# Patient Record
Sex: Female | Born: 1945 | Race: White | Hispanic: No | Marital: Single | Smoking: Never smoker
Health system: Southern US, Community
[De-identification: ages and names within clinical notes are randomized; demographics above are authoritative.]

## PROBLEM LIST (undated history)

## (undated) DIAGNOSIS — I809 Phlebitis and thrombophlebitis of unspecified site: Secondary | ICD-10-CM

## (undated) HISTORY — DX: Phlebitis and thrombophlebitis of unspecified site: I80.9

## (undated) HISTORY — PX: OVARIAN CYST REMOVAL: SHX89

## (undated) HISTORY — PX: VEIN SURGERY: SHX48

## (undated) HISTORY — PX: TONSILLECTOMY: SUR1361

## (undated) HISTORY — PX: BUNIONECTOMY: SHX129

## (undated) HISTORY — PX: ABDOMINAL HYSTERECTOMY: SHX81

## (undated) HISTORY — PX: DILATION AND CURETTAGE OF UTERUS: SHX78

## (undated) HISTORY — DX: Gilbert syndrome: E80.4

---

## 2000-07-16 ENCOUNTER — Other Ambulatory Visit: Admission: RE | Admit: 2000-07-16 | Discharge: 2000-07-16 | Payer: Self-pay | Admitting: Obstetrics and Gynecology

## 2001-07-29 ENCOUNTER — Other Ambulatory Visit: Admission: RE | Admit: 2001-07-29 | Discharge: 2001-07-29 | Payer: Self-pay | Admitting: Obstetrics and Gynecology

## 2002-02-04 ENCOUNTER — Other Ambulatory Visit: Admission: RE | Admit: 2002-02-04 | Discharge: 2002-02-04 | Payer: Self-pay | Admitting: Obstetrics and Gynecology

## 2002-05-02 ENCOUNTER — Encounter: Payer: Self-pay | Admitting: Obstetrics and Gynecology

## 2002-05-02 ENCOUNTER — Ambulatory Visit (HOSPITAL_COMMUNITY): Admission: RE | Admit: 2002-05-02 | Discharge: 2002-05-02 | Payer: Self-pay | Admitting: Obstetrics and Gynecology

## 2002-06-04 ENCOUNTER — Other Ambulatory Visit: Admission: RE | Admit: 2002-06-04 | Discharge: 2002-06-04 | Payer: Self-pay | Admitting: Obstetrics and Gynecology

## 2002-11-10 ENCOUNTER — Other Ambulatory Visit: Admission: RE | Admit: 2002-11-10 | Discharge: 2002-11-10 | Payer: Self-pay | Admitting: Obstetrics and Gynecology

## 2004-06-15 ENCOUNTER — Other Ambulatory Visit: Admission: RE | Admit: 2004-06-15 | Discharge: 2004-06-15 | Payer: Self-pay | Admitting: Obstetrics and Gynecology

## 2004-06-30 ENCOUNTER — Encounter: Payer: Self-pay | Admitting: Internal Medicine

## 2004-08-11 ENCOUNTER — Ambulatory Visit: Payer: Self-pay | Admitting: Internal Medicine

## 2004-09-07 ENCOUNTER — Ambulatory Visit: Payer: Self-pay | Admitting: Internal Medicine

## 2004-11-18 ENCOUNTER — Ambulatory Visit: Payer: Self-pay | Admitting: Internal Medicine

## 2004-11-25 ENCOUNTER — Ambulatory Visit: Payer: Self-pay | Admitting: Internal Medicine

## 2004-12-12 ENCOUNTER — Other Ambulatory Visit: Admission: RE | Admit: 2004-12-12 | Discharge: 2004-12-12 | Payer: Self-pay | Admitting: Obstetrics and Gynecology

## 2004-12-26 ENCOUNTER — Ambulatory Visit: Payer: Self-pay | Admitting: Gastroenterology

## 2005-01-16 ENCOUNTER — Ambulatory Visit: Payer: Self-pay | Admitting: Internal Medicine

## 2005-02-13 ENCOUNTER — Ambulatory Visit: Payer: Self-pay | Admitting: Internal Medicine

## 2005-05-09 ENCOUNTER — Ambulatory Visit: Payer: Self-pay | Admitting: Internal Medicine

## 2005-07-03 ENCOUNTER — Ambulatory Visit: Payer: Self-pay | Admitting: Internal Medicine

## 2005-07-14 ENCOUNTER — Ambulatory Visit: Payer: Self-pay | Admitting: Internal Medicine

## 2005-08-02 ENCOUNTER — Ambulatory Visit: Payer: Self-pay | Admitting: Internal Medicine

## 2005-09-07 ENCOUNTER — Other Ambulatory Visit: Admission: RE | Admit: 2005-09-07 | Discharge: 2005-09-07 | Payer: Self-pay | Admitting: Obstetrics and Gynecology

## 2005-10-20 ENCOUNTER — Ambulatory Visit: Payer: Self-pay | Admitting: Internal Medicine

## 2005-11-01 ENCOUNTER — Encounter: Admission: RE | Admit: 2005-11-01 | Discharge: 2005-11-01 | Payer: Self-pay | Admitting: Internal Medicine

## 2006-03-16 ENCOUNTER — Ambulatory Visit: Payer: Self-pay | Admitting: Internal Medicine

## 2006-04-16 ENCOUNTER — Ambulatory Visit: Payer: Self-pay | Admitting: Internal Medicine

## 2006-06-14 ENCOUNTER — Ambulatory Visit: Payer: Self-pay | Admitting: Internal Medicine

## 2009-03-01 ENCOUNTER — Ambulatory Visit: Payer: Self-pay | Admitting: Internal Medicine

## 2009-03-01 DIAGNOSIS — R51 Headache: Secondary | ICD-10-CM | POA: Insufficient documentation

## 2009-03-01 DIAGNOSIS — F329 Major depressive disorder, single episode, unspecified: Secondary | ICD-10-CM

## 2009-03-01 DIAGNOSIS — R519 Headache, unspecified: Secondary | ICD-10-CM | POA: Insufficient documentation

## 2009-03-01 DIAGNOSIS — R55 Syncope and collapse: Secondary | ICD-10-CM | POA: Insufficient documentation

## 2009-03-01 DIAGNOSIS — M25519 Pain in unspecified shoulder: Secondary | ICD-10-CM | POA: Insufficient documentation

## 2009-03-01 DIAGNOSIS — F3289 Other specified depressive episodes: Secondary | ICD-10-CM | POA: Insufficient documentation

## 2009-03-01 DIAGNOSIS — R6883 Chills (without fever): Secondary | ICD-10-CM | POA: Insufficient documentation

## 2009-03-01 DIAGNOSIS — I10 Essential (primary) hypertension: Secondary | ICD-10-CM | POA: Insufficient documentation

## 2009-03-03 ENCOUNTER — Inpatient Hospital Stay (HOSPITAL_COMMUNITY): Admission: AD | Admit: 2009-03-03 | Discharge: 2009-03-05 | Payer: Self-pay | Admitting: Internal Medicine

## 2009-03-03 DIAGNOSIS — J45909 Unspecified asthma, uncomplicated: Secondary | ICD-10-CM | POA: Insufficient documentation

## 2009-03-03 DIAGNOSIS — K5730 Diverticulosis of large intestine without perforation or abscess without bleeding: Secondary | ICD-10-CM | POA: Insufficient documentation

## 2009-05-14 ENCOUNTER — Telehealth (INDEPENDENT_AMBULATORY_CARE_PROVIDER_SITE_OTHER): Payer: Self-pay | Admitting: *Deleted

## 2009-05-31 ENCOUNTER — Ambulatory Visit: Payer: Self-pay | Admitting: Internal Medicine

## 2009-07-13 ENCOUNTER — Encounter (INDEPENDENT_AMBULATORY_CARE_PROVIDER_SITE_OTHER): Payer: Self-pay | Admitting: *Deleted

## 2009-07-21 ENCOUNTER — Ambulatory Visit: Payer: Self-pay | Admitting: Internal Medicine

## 2009-08-09 ENCOUNTER — Telehealth (INDEPENDENT_AMBULATORY_CARE_PROVIDER_SITE_OTHER): Payer: Self-pay | Admitting: *Deleted

## 2009-08-11 ENCOUNTER — Telehealth (INDEPENDENT_AMBULATORY_CARE_PROVIDER_SITE_OTHER): Payer: Self-pay | Admitting: *Deleted

## 2009-08-17 ENCOUNTER — Encounter: Payer: Self-pay | Admitting: Internal Medicine

## 2009-10-11 ENCOUNTER — Telehealth (INDEPENDENT_AMBULATORY_CARE_PROVIDER_SITE_OTHER): Payer: Self-pay | Admitting: *Deleted

## 2009-10-13 ENCOUNTER — Telehealth (INDEPENDENT_AMBULATORY_CARE_PROVIDER_SITE_OTHER): Payer: Self-pay | Admitting: *Deleted

## 2009-10-13 ENCOUNTER — Encounter (INDEPENDENT_AMBULATORY_CARE_PROVIDER_SITE_OTHER): Payer: Self-pay | Admitting: *Deleted

## 2009-11-22 ENCOUNTER — Telehealth (INDEPENDENT_AMBULATORY_CARE_PROVIDER_SITE_OTHER): Payer: Self-pay | Admitting: *Deleted

## 2009-12-27 ENCOUNTER — Ambulatory Visit: Payer: Self-pay | Admitting: Internal Medicine

## 2009-12-28 LAB — CONVERTED CEMR LAB
Basophils Relative: 1.1 % (ref 0.0–3.0)
Eosinophils Relative: 3 % (ref 0.0–5.0)
Hemoglobin: 13.3 g/dL (ref 12.0–15.0)
Lymphocytes Relative: 24.6 % (ref 12.0–46.0)
MCV: 95.2 fL (ref 78.0–100.0)
Monocytes Absolute: 0.7 10*3/uL (ref 0.1–1.0)
Neutro Abs: 2.7 10*3/uL (ref 1.4–7.7)
Neutrophils Relative %: 56.7 % (ref 43.0–77.0)
RBC: 4.04 M/uL (ref 3.87–5.11)
WBC: 4.7 10*3/uL (ref 4.5–10.5)

## 2010-04-13 ENCOUNTER — Telehealth (INDEPENDENT_AMBULATORY_CARE_PROVIDER_SITE_OTHER): Payer: Self-pay | Admitting: *Deleted

## 2010-04-13 ENCOUNTER — Encounter: Payer: Self-pay | Admitting: Internal Medicine

## 2010-04-14 ENCOUNTER — Observation Stay (HOSPITAL_COMMUNITY): Admission: EM | Admit: 2010-04-14 | Discharge: 2010-04-15 | Payer: Self-pay | Admitting: Emergency Medicine

## 2010-05-04 ENCOUNTER — Telehealth: Payer: Self-pay | Admitting: Internal Medicine

## 2010-08-09 ENCOUNTER — Telehealth (INDEPENDENT_AMBULATORY_CARE_PROVIDER_SITE_OTHER): Payer: Self-pay | Admitting: *Deleted

## 2010-10-11 NOTE — Progress Notes (Signed)
Summary: Melanie Mcneil personal issue would like a call  Phone Note Call from Patient Call back at [facility] Phone [phone]   Caller: Patient Summary of Call: pt called stating that it was a personal matter and she would like for dr hopper to give her a call no further detail was given................Marland KitchenFelecia Deloach CMA  October 13, 2009 5:05 PM   Follow-up for Phone Call        I called 8am & 7:10 pm & got voice mail. I asked her to tell you what she needed so we could get started on any issue of concern  & left my email. Follow-up by: Marga Melnick MD,  October 14, 2009 7:11 PM  Additional Follow-up for Phone Call Additional follow up Details #1::        pt states that she received her bone density along with a copy of someone else. pt states that she will shred other pt copy and call to give other pt name so that we may get a copy to that pt. pt just want you to know it was nothing dealing with her but she did not want to broadcast it because she know it is a violation. pt states that she will come in when she gets back in town to discuss her bone density results..........Marland KitchenFelecia Deloach CMA  October 15, 2009 8:47 AM     Additional Follow-up for Phone Call Additional follow up Details #2::    thank her very much; she can bring that report also when she comes in Follow-up by: Marga Melnick MD,  October 15, 2009 1:27 PM  Additional Follow-up for Phone Call Additional follow up Details #3:: Details for Additional Follow-up Action Taken: left message to call  office..............Marland KitchenFelecia Deloach CMA  October 18, 2009 4:24 PM   pt called back with pt name,bone density mailed to correct pt. thank pt for information..................Marland KitchenFelecia Deloach CMA  October 21, 2009 11:07 AM

## 2010-10-11 NOTE — Letter (Signed)
Summary: [facility]  [facility]   Imported By: Lanelle Bal 04/26/2010 08:40:44  _____________________________________________________________________  External Attachment:    Type:   Image     Comment:   External Document

## 2010-10-11 NOTE — Progress Notes (Signed)
Summary: Refill Request  Phone Note Refill Request Message from:  Pharmacy on [facility] [address] Fax #: [phone]  Refills Requested: Medication #1:  HYDROCHLOROTHIAZIDE 12.5 MG TABS 1 by mouth two times a day   Dosage confirmed as above?Dosage Confirmed   Supply Requested: 1 month   Last Refilled: 09/06/2009 Next Appointment Scheduled: none Initial call taken by: Harold Barban,  November 22, 2009 1:21 PM    Prescriptions: HYDROCHLOROTHIAZIDE 12.5 MG TABS (HYDROCHLOROTHIAZIDE) 1 by mouth two times a day  #30 x 5   Entered by:   Shonna Chock   Authorized by:   Marga Melnick MD   Signed by:   Shonna Chock on 11/22/2009   Method used:   Electronically to        [facility] [address] (retail)       [address]       [place], [place]  [address]       Ph: [phone]       Fax: [phone]   RxID:   [id number]

## 2010-10-11 NOTE — Progress Notes (Signed)
Summary: tried to call several times no answers letter mailed  Phone Note Call from Patient Call back at Home Phone [phone]   Caller: Patient Summary of Call: pt left VM that she would like for Dr Alwyn Ren to give her a call so that she may give him a update. left message to call office............Marland KitchenFelecia Deloach CMA  October 11, 2009 2:47 PM  left message to call office........................Marland KitchenFelecia Deloach CMA  October 12, 2009 10:49 AM  left message to call office., letter mailed....................Marland KitchenFelecia Deloach CMA  October 13, 2009 10:22 AM

## 2010-10-11 NOTE — Assessment & Plan Note (Signed)
Summary: coughing/congestion/kdc   Vital Signs:  Patient profile:   65 year old female Weight:      136.6 pounds Temp:     98.1 degrees F oral Pulse rate:   76 / minute Resp:     14 per minute BP sitting:   136 / 80  (left arm) Cuff size:   large  Vitals Entered By: Shonna Chock (December 27, 2009 10:43 AM) CC: 1.) Cough and congestion. Took Levaquin x 6 weeks ago for Bronchitis and symptoms never cleared  2.) Discuss Iron (recommended by GYN) Comments REVIEWED MED LIST, PATIENT AGREED DOSE AND INSTRUCTION CORRECT    CC:  1.) Cough and congestion. Took Levaquin x 6 weeks ago for Bronchitis and symptoms never cleared  2.) Discuss Iron (recommended by GYN).  History of Present Illness: Cough after bronchitis 6 weeks ago ; treated with Levaquin 500 mg X 7 days.Fever & SOB improved ; cough never resolved completely. Cough worse with allergen exposure. The patient reports non-productive cough, shortness of breath, wheezing, exertional dyspnea, and malaise, but denies pleuritic chest pain, fever, and hemoptysis.  The patient denies the following symptoms: sore throat, nasal congestion, chronic rhinitis, weight loss, acid reflux symptoms, and peripheral edema.  Ineffective prior treatments have included OTC cough medication.  Risk factors include history of allergic rhinitis and history of asthma.  No Symbicort X 3 weeks. Singulair costs $138 / month.  Allergies: 1)  ! Amoxicillin 2)  ! Augmentin 3)  ! Prednisone  Review of Systems General:  Denies chills, fever, and sweats. ENT:  Denies difficulty swallowing, ear discharge, earache, hoarseness, and nosebleeds; No frontal headaches , facial pain or purulence. Nares dry. Resp:  Denies coughing up blood; With Mucinex D  she has had thick green secretions; mainly cough is dry.  Physical Exam  General:  in no acute distress; alert,appropriate and cooperative throughout examination Ears:  External ear exam shows no significant lesions or  deformities.  Otoscopic examination reveals clear canals, tympanic membranes are intact bilaterally without bulging, retraction, inflammation or discharge. Hearing is grossly normal bilaterally. Nose:  External nasal examination shows no deformity or inflammation. Nasal mucosa are dry without lesions or exudates. Mouth:  Oral mucosa and oropharynx without lesions or exudates.  Teeth in good repair. Lungs:  Normal respiratory effort, chest expands symmetrically. Lungs are clear to auscultation, no  wheezes ; mild rales. Heart:  Normal rate and regular rhythm. S1 and S2 normal without gallop, murmur, click, rub. Extremities:  No clubbing, cyanosis, edema. Cervical Nodes:  No lymphadenopathy noted Axillary Nodes:  No palpable lymphadenopathy   Impression & Recommendations:  Problem # 1:  BRONCHITIS-ACUTE (ICD-466.0)  The following medications were removed from the medication list:    Clindamycin Hcl 300 Mg Caps (Clindamycin hcl) ..... I q 6 hrs    Smz-tmp Ds 800-160 Mg Tabs (Sulfamethoxazole-trimethoprim) .Marland Kitchen... 1 two times a day Her updated medication list for this problem includes:    Singulair 10 Mg Tabs (Montelukast sodium) .Marland Kitchen... 1 by mouth once daily    Symbicort 160-4.5 Mcg/act Aero (Budesonide-formoterol fumarate) .Marland Kitchen... As needed    Promethazine Vc/codeine 6.25-5-10 Mg/21ml Syrp (Phenyleph-promethazine-cod) .Marland Kitchen... 1 tsp every 6 hrs as needed cough    Azithromycin 250 Mg Tabs (Azithromycin) .Marland Kitchen... As per pack    Ventolin Hfa 108 (90 Base) Mcg/act Aers (Albuterol sulfate) .Marland Kitchen... 1-2 puffs every 4 hrs as needed cough  Orders: T-2 View CXR (71020TC) Venipuncture (01093) TLB-CBC Platelet - w/Differential (85025-CBCD)  Problem # 2:  ASTHMA (  ICD-493.90)  PMH of Her updated medication list for this problem includes:    Singulair 10 Mg Tabs (Montelukast sodium) .Marland Kitchen... 1 by mouth once daily    Symbicort 160-4.5 Mcg/act Aero (Budesonide-formoterol fumarate) .Marland Kitchen... As needed    Ventolin Hfa 108  (90 Base) Mcg/act Aers (Albuterol sulfate) .Marland Kitchen... 1-2 puffs every 4 hrs as needed cough  Orders: T-2 View CXR (71020TC) Venipuncture (29528) TLB-CBC Platelet - w/Differential (85025-CBCD)  Complete Medication List: 1)  Effexor Xr 75 Mg Xr24h-cap (Venlafaxine hcl) .Marland Kitchen.. 1 by mouth once daily 2)  Wellbutrin Xl 150 Mg Xr24h-tab (Bupropion hcl) .Marland Kitchen.. 1 by mouth once daily 3)  Hydrochlorothiazide 12.5 Mg Tabs (Hydrochlorothiazide) .Marland Kitchen.. 1 by mouth two times a day 4)  Singulair 10 Mg Tabs (Montelukast sodium) .Marland Kitchen.. 1 by mouth once daily 5)  Patanol 0.1 % Soln (Olopatadine hcl) .... As needed 6)  Aleve 220 Mg Tabs (Naproxen sodium) .... 6 tablets daily 7)  Symbicort 160-4.5 Mcg/act Aero (Budesonide-formoterol fumarate) .... As needed 8)  Premarin (? Dose)  .... 2 x weekly 9)  Promethazine Vc/codeine 6.25-5-10 Mg/65ml Syrp (Phenyleph-promethazine-cod) .Marland Kitchen.. 1 tsp every 6 hrs as needed cough 10)  Azithromycin 250 Mg Tabs (Azithromycin) .... As per pack 11)  Ventolin Hfa 108 (90 Base) Mcg/act Aers (Albuterol sulfate) .Marland Kitchen.. 1-2 puffs every 4 hrs as needed cough  Patient Instructions: 1)  Drink as much fluid as you can tolerate for the next few days. Prescriptions: VENTOLIN HFA 108 (90 BASE) MCG/ACT AERS (ALBUTEROL SULFATE) 1-2 puffs every 4 hrs as needed cough  #1 x 2   Entered and Authorized by:   Marga Melnick MD   Signed by:   Marga Melnick MD on 12/27/2009   Method used:   Print then Give to Patient   RxID:   [id number] AZITHROMYCIN 250 MG TABS (AZITHROMYCIN) as per pack  #1 x 0   Entered and Authorized by:   Marga Melnick MD   Signed by:   Marga Melnick MD on 12/27/2009   Method used:   Print then Give to Patient   RxID:   [id number] PROMETHAZINE VC/CODEINE 6.25-5-10 MG/5ML SYRP (PHENYLEPH-PROMETHAZINE-COD) 1 tsp every 6 hrs as needed cough  #120cc x 0   Entered and Authorized by:   Marga Melnick MD   Signed by:   Marga Melnick MD on 12/27/2009   Method used:   Print then Give  to Patient   RxID:   [id number]

## 2010-10-11 NOTE — Letter (Signed)
Summary: Generic Letter  [facility] [place]  [address] [place], [place] [address]   Phone: [phone]  Fax: [phone]    10/13/2009  Re[place] [address] [place], [place]  [address]  Dear Ms. Croker,    We have tried several times to contact you in regard to a message left by you. If you still would like to update this information please give [place] a call back at [phone].       Sincerely,       Chrissie Noa Hopper,MD

## 2010-10-11 NOTE — Progress Notes (Signed)
Summary: Call Back  Phone Note Call from Patient Call back at Home Phone [phone]   Caller: Patient Summary of Call: Patient called and would like you to call her back. After hours is ok. She has some questions.  Initial call taken by: Freddy Jaksch,  May 04, 2010 3:11 PM  Follow-up for Phone Call        left phone message for her to email me with ?s Follow-up by: Marga Melnick MD,  May 05, 2010 5:36 PM

## 2010-10-11 NOTE — Progress Notes (Signed)
Summary: refill  Phone Note Refill Request Message from:  Patient  Refills Requested: Medication #1:  SINGULAIR 10 MG TABS 1 by mouth once daily patient lives in [place] - she will be in town today wants to pick up rx  Initial call taken by: Okey Regal Spring,  August 09, 2010 12:05 PM    Prescriptions: SINGULAIR 10 MG TABS (MONTELUKAST SODIUM) 1 by mouth once daily  #30 x 5   Entered by:   Shonna Chock CMA   Authorized by:   Marga Melnick MD   Signed by:   Shonna Chock CMA on 08/09/2010   Method used:   Print then Give to Patient   RxID:   [id number]

## 2010-10-11 NOTE — Progress Notes (Signed)
Summary: Kidney stone  Phone Note Call from Patient Call back at Home Phone [phone]   Summary of Call: Patient will not make it due to kidney stone and would like to know where to go to get it taken care of. Initial call taken by: Lucious Groves CMA,  April 13, 2010 1:40 PM  Follow-up for Phone Call        Per [facility] patient can go to [facility] or ER. Called to notify the patient, left message on voicemail to call back to office. Lucious Groves CMA  April 13, 2010 1:43 PM   No return call from patient, called agian, left message on voicemail to call back to office. Lucious Groves CMA  April 14, 2010 9:40 AM   Additional Follow-up for Phone Call Additional follow up Details #1::        No rtn call from patient, left message on voicemail to call back to office. Lucious Groves CMA,  April 15, 2010 2:55 PM    Additional Follow-up for Phone Call Additional follow up Details #2::    Patient's mail box is full and is unable to accept any messages at this time./Chrae [facility] CMA  April 20, 2010 9:34 AM    I pulled up [facility] and patient was seen in the ER on 04/14/2010./Chrae [facility] CMA  April 20, 2010 9:40 AM

## 2010-10-26 ENCOUNTER — Telehealth (INDEPENDENT_AMBULATORY_CARE_PROVIDER_SITE_OTHER): Payer: Self-pay | Admitting: *Deleted

## 2010-11-02 ENCOUNTER — Encounter: Payer: Self-pay | Admitting: Internal Medicine

## 2010-11-02 NOTE — Progress Notes (Signed)
Summary: refill  Phone Note Refill Request Message from:  Fax from Pharmacy on October 26, 2010 9:14 AM  Refills Requested: Medication #1:  HYDROCHLOROTHIAZIDE 12.5 MG TABS 1 by mouth two times a day [facility] [phone]  Initial call taken by: Okey Regal Spring,  October 26, 2010 9:15 AM    Prescriptions: HYDROCHLOROTHIAZIDE 12.5 MG TABS (HYDROCHLOROTHIAZIDE) 1 by mouth two times a day  #60 Capsule x 2   Entered by:   Shonna Chock CMA   Authorized by:   Marga Melnick MD   Signed by:   Shonna Chock CMA on 10/26/2010   Method used:   [facility]        [facility] [address] (retail)       [address] [place], [place]  [address]       Ph: [phone]       Fax: [phone]   RxID:   [id number]

## 2010-11-25 LAB — URINALYSIS, ROUTINE W REFLEX MICROSCOPIC
Bilirubin Urine: NEGATIVE
Protein, ur: NEGATIVE mg/dL
Urobilinogen, UA: 0.2 mg/dL (ref 0.0–1.0)

## 2010-11-25 LAB — DIFFERENTIAL
Basophils Relative: 0 % (ref 0–1)
Eosinophils Absolute: 0 10*3/uL (ref 0.0–0.7)
Eosinophils Relative: 0 % (ref 0–5)
Monocytes Absolute: 0.5 10*3/uL (ref 0.1–1.0)
Monocytes Relative: 6 % (ref 3–12)

## 2010-11-25 LAB — CBC
HCT: 37.6 % (ref 36.0–46.0)
Hemoglobin: 13.2 g/dL (ref 12.0–15.0)
MCH: 33.4 pg (ref 26.0–34.0)
MCHC: 35.1 g/dL (ref 30.0–36.0)

## 2010-11-25 LAB — URINE MICROSCOPIC-ADD ON

## 2010-11-25 LAB — BASIC METABOLIC PANEL
BUN: 17 mg/dL (ref 6–23)
CO2: 24 mEq/L (ref 19–32)
Glucose, Bld: 136 mg/dL — ABNORMAL HIGH (ref 70–99)
Potassium: 3.4 mEq/L — ABNORMAL LOW (ref 3.5–5.1)
Sodium: 141 mEq/L (ref 135–145)

## 2010-12-06 IMAGING — CR DG CHEST 2V
2 series · 2 of 2 positions shown · non-contrast
Comparison: None

CLINICAL DATA: Short of breath, headache, chills

CHEST - 2 VIEW

[w chest pa *]
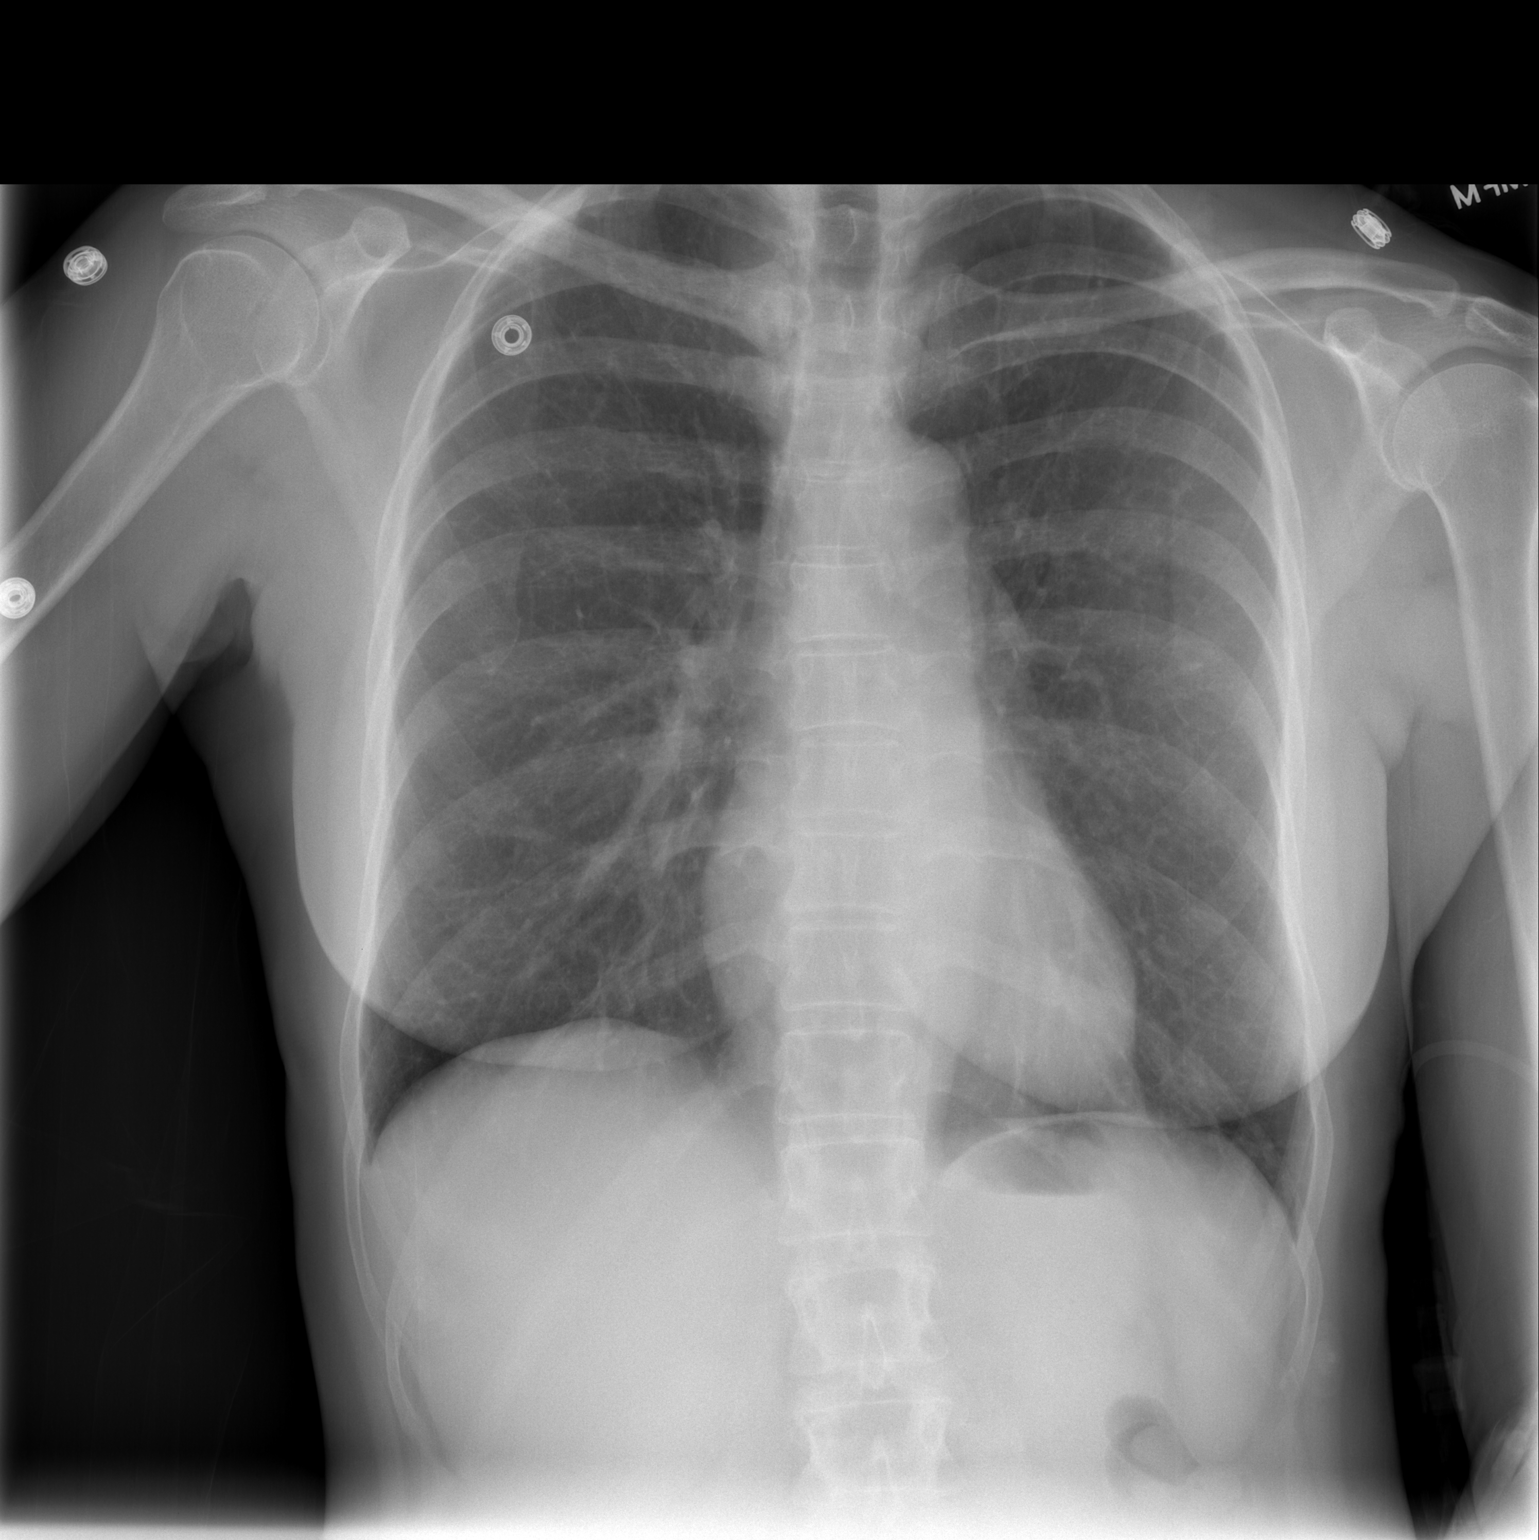

[w chest lat]
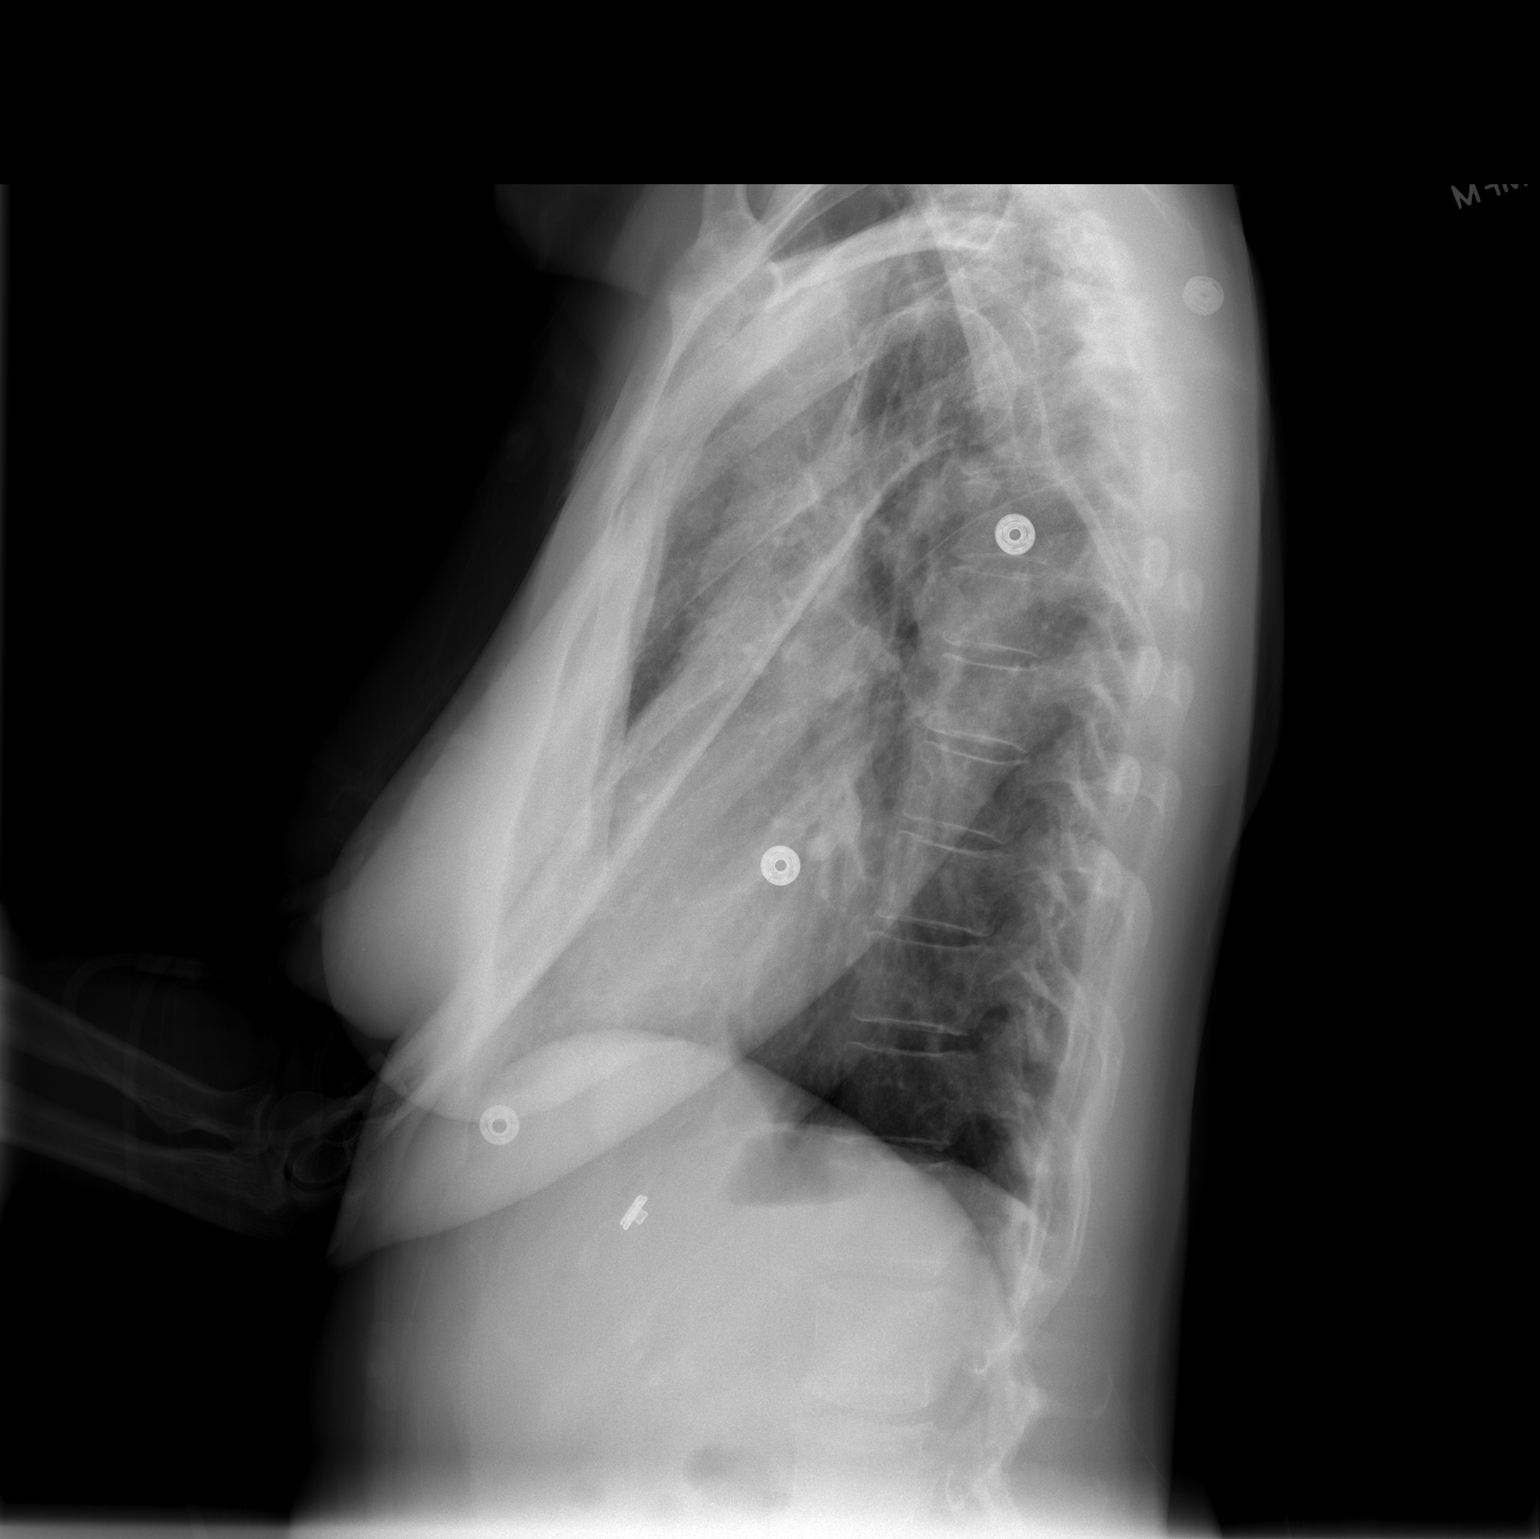

[2 of 2 positions shown; findings below may reference images not displayed]

FINDINGS: The lungs are clear. The heart is within normal limits in
size. No bony abnormality is seen. The left humerus overlies the
lungs on the lateral view
IMPRESSION: No active lung disease.

## 2010-12-06 IMAGING — CR DG SHOULDER 2+V*L*
4 series · 4 of 4 positions shown · non-contrast
Comparison: None

CLINICAL DATA: Left shoulder pain for several weeks, no acute
injury

LEFT SHOULDER - 2+ VIEW

[t shoulder ap internal left]
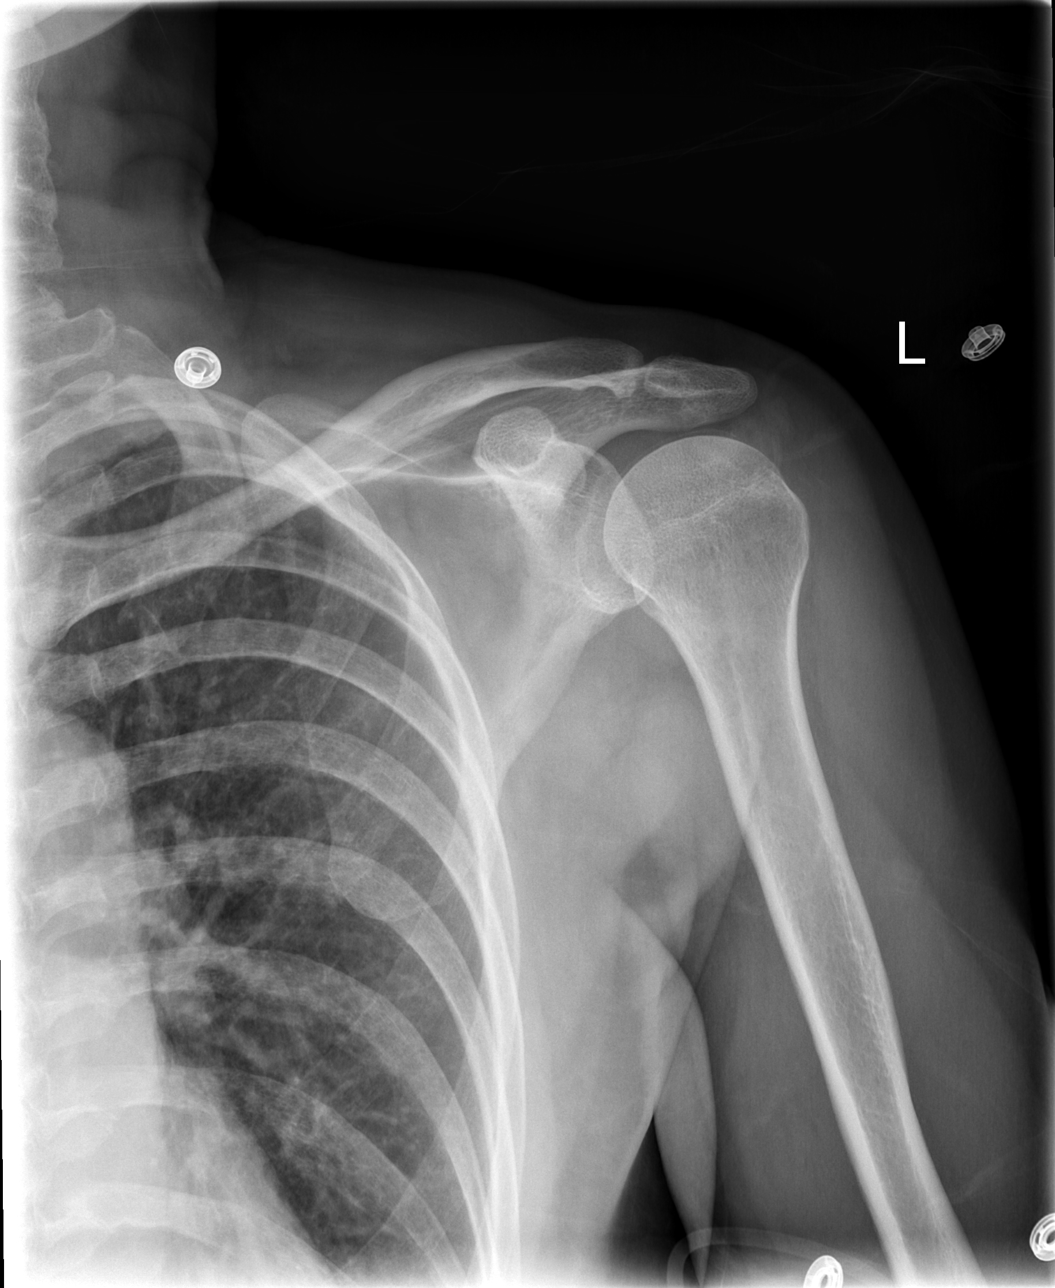

[t shoulder ap external left]
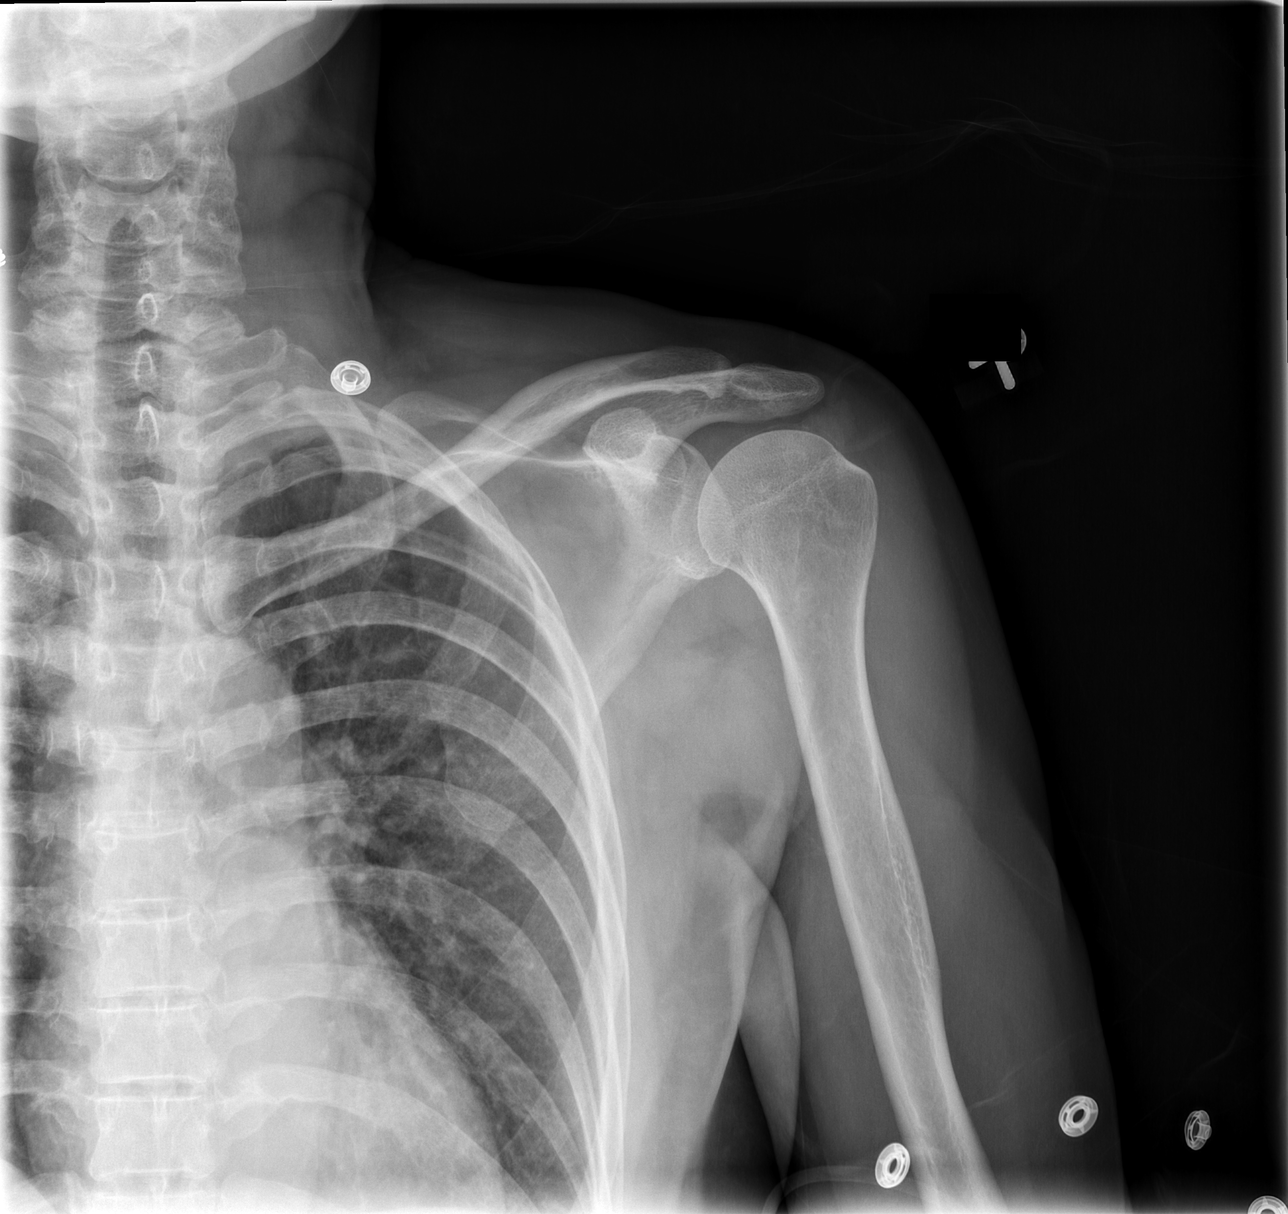

[t shoulder y view left *]
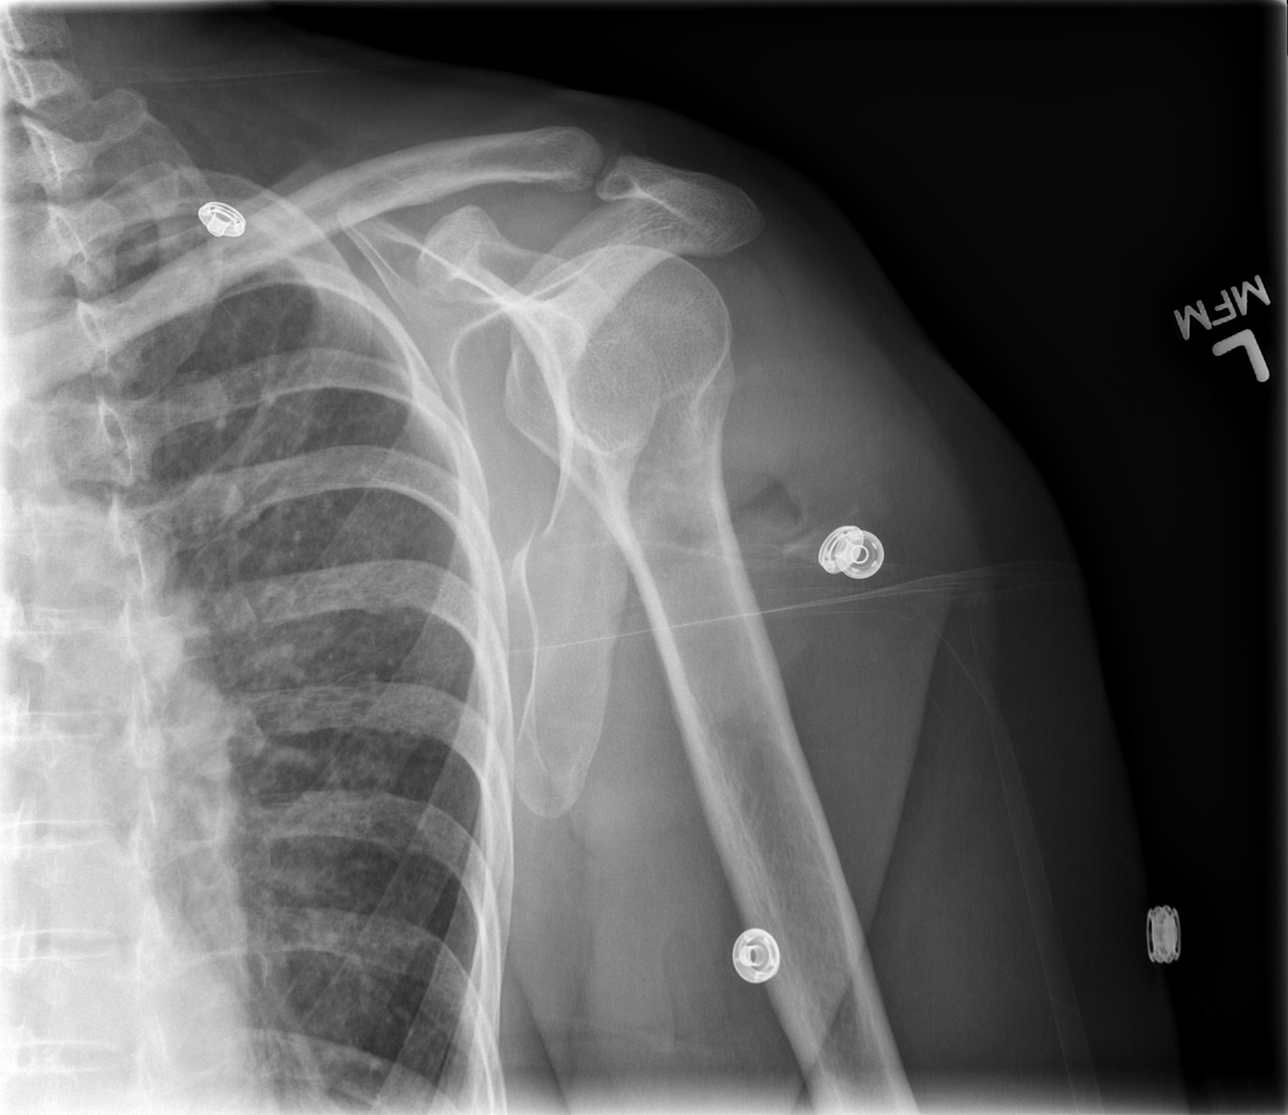

[t shoulder y view left]
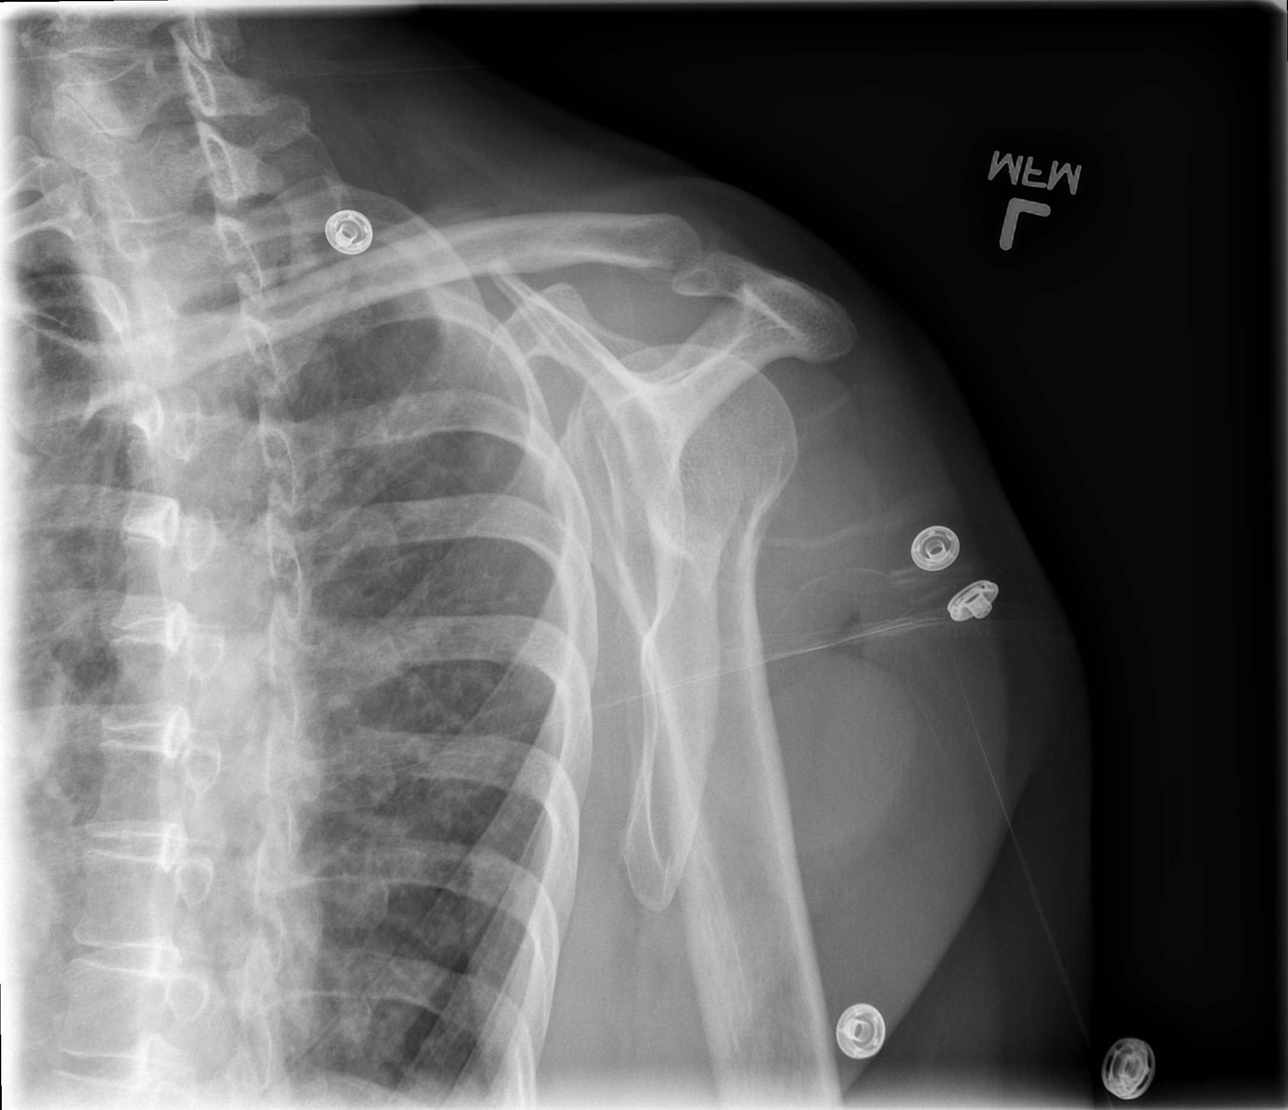

[4 of 4 positions shown; findings below may reference images not displayed]

FINDINGS: The left glenohumeral joint space appears normal.  There
is mild degenerative change at the AC joint.  No acute bony
abnormality is seen.
IMPRESSION: No acute abnormality.

## 2010-12-08 IMAGING — RF DG FLUORO GUIDE NDL PLC/BX
1 series · 1 of 1 positions shown · non-contrast
Comparison: MRI 03/03/2009

CLINICAL DATA: Left shoulder joint effusion

FLUORO GUIDED NEEDLE PLACEMENT

[Series 1: run · 1 of 1 slices shown]
[im 1/1]
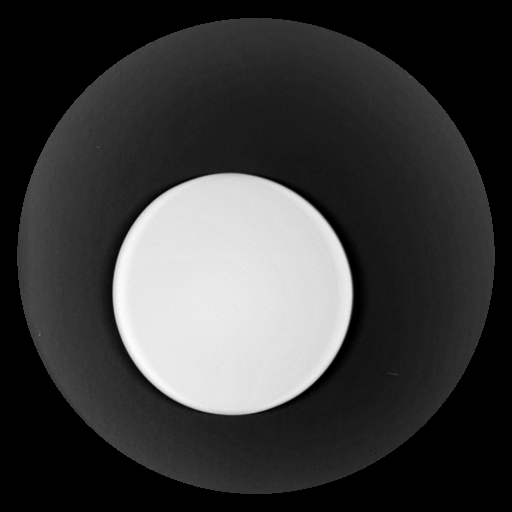

[1 of 1 positions shown; findings below may reference images not displayed]

FINDINGS: Informed consent was obtained. A "time out" was
performed.  Under fluoroscopic guidance an aspiration site was
selected.  Skin was draped and prepped in the normal sterile
fashion.  1% lidocaine was used to anesthetize the aspiration site.
A 20 gauge aspiration needle was advanced into the joint space.
Only scant bloody fluid was aspirated (less than 1 ml).  The
patient was uncomfortable during procedure and external rotation
was difficult for patient due to shoulder pain.
IMPRESSION: Left shoulder joint aspiration with only scant bloody fluid
aspirated.

## 2010-12-12 ENCOUNTER — Encounter: Payer: Self-pay | Admitting: Internal Medicine

## 2010-12-19 LAB — CBC
MCHC: 34.8 g/dL (ref 30.0–36.0)
RBC: 4.08 MIL/uL (ref 3.87–5.11)
RDW: 12.7 % (ref 11.5–15.5)

## 2010-12-19 LAB — D-DIMER, QUANTITATIVE: D-Dimer, Quant: <0.22 ug/mL-FEU (ref 0.00–0.48)

## 2010-12-19 LAB — DIFFERENTIAL
Basophils Absolute: 0 10*3/uL (ref 0.0–0.1)
Basophils Relative: 0 % (ref 0–1)
Monocytes Relative: 6 % (ref 3–12)
Neutro Abs: 6.9 10*3/uL (ref 1.7–7.7)
Neutrophils Relative %: 86 % — ABNORMAL HIGH (ref 43–77)

## 2010-12-19 LAB — URINALYSIS, MICROSCOPIC ONLY
Bilirubin Urine: NEGATIVE
Hgb urine dipstick: NEGATIVE
Specific Gravity, Urine: 1.014 (ref 1.005–1.030)
pH: 6.5 (ref 5.0–8.0)

## 2010-12-19 LAB — BASIC METABOLIC PANEL
CO2: 27 mEq/L (ref 19–32)
Calcium: 8.5 mg/dL (ref 8.4–10.5)
Calcium: 9.4 mg/dL (ref 8.4–10.5)
Chloride: 104 mEq/L (ref 96–112)
GFR calc Af Amer: >60 mL/min (ref >60)
GFR calc Af Amer: >60 mL/min (ref >60)
GFR calc non Af Amer: >60 mL/min (ref >60)
Glucose, Bld: 104 mg/dL — ABNORMAL HIGH (ref 70–99)
Sodium: 139 mEq/L (ref 135–145)
Sodium: 141 mEq/L (ref 135–145)

## 2010-12-19 LAB — URINE CULTURE
Colony Count: 3000
Special Requests: NEGATIVE

## 2010-12-19 LAB — ROCKY MTN SPOTTED FVR AB, IGM-BLOOD: RMSF IgM: 0.18 IV (ref 0.00–0.89)

## 2010-12-19 LAB — VITAMIN D 1,25 DIHYDROXY
Vitamin D2 1, 25 (OH)2: UNDETERMINED
Vitamin D3 1, 25 (OH)2: UNDETERMINED

## 2010-12-19 LAB — WOUND CULTURE: Gram Stain: NONE SEEN

## 2010-12-19 LAB — URIC ACID: Uric Acid, Serum: 4.1 mg/dL (ref 2.4–7.0)

## 2010-12-19 LAB — ANAEROBIC CULTURE

## 2010-12-19 LAB — CARDIAC PANEL(CRET KIN+CKTOT+MB+TROPI)
Relative Index: INVALID (ref 0.0–2.5)
Total CK: 99 U/L (ref 7–177)

## 2010-12-19 LAB — ROCKY MTN SPOTTED FVR AB, IGG-BLOOD: RMSF IgG: 0.1 IV

## 2010-12-19 LAB — C-REACTIVE PROTEIN: CRP: 8 mg/dL — ABNORMAL HIGH (ref <0.6)

## 2010-12-19 LAB — HEMOGLOBIN A1C
Hgb A1c MFr Bld: 5.9 % (ref 4.6–6.1)
Mean Plasma Glucose: 123 mg/dL

## 2011-01-24 NOTE — Discharge Summary (Signed)
NAMEJONATHAN, Melanie Mcneil              ACCOUNT NO.:  [id number]   MEDICAL RECORD NO.:  [id number]          PATIENT TYPE:  INP   LOCATION:  1414                         FACILITY:  [facility]   PHYSICIAN:  Titus Dubin. Hopper, MD,FACP,FCCPDATE OF BIRTH:  1947/09/21   DATE OF ADMISSION:  03/01/2009  DATE OF DISCHARGE:  03/05/2009                               DISCHARGE SUMMARY   ADMITTING DIAGNOSES:  1. Shoulder pain, bilateral.  2. Headache.  3. Chills without fever.  4. Near syncope.   DISCHARGE DIAGNOSES:  1. Shoulder pain, bilateral.  2. Headache.  3. Chills without fever.  4. Near syncope.  5. Inflammatory arthritis left shoulder with  effusion which is      resolving.  6. Low-grade fever secondary to #5.   For detailed history and physical please see the dictation of March 01, 2009.   The patient was admitted initially to observation because of the chest  pain  & bilateral shoulder pain which radiated from the left shoulder to  the right shoulder and was associated with near syncope.  A cardiac  etiology was of concern.   Serial cardiac enzymes were negative for cardiac injury.  D-dimer was  also normal.  White blood count was normal at 8100 but she had 86%  neutrophils.  BMET revealed a potassium of 3.3 and a glucose of 205.  It  is noted that she takes hydrochlorothiazide 12.5 mg as needed for pedal  edema.  Because of the chills ,urinalysis was collected.  This revealed  few bacteria.  Subsequent culture revealed 3000 colonies with  insignificant growth.  Because of the hyperglycemia an A1c was performed  which was 5.9, nondiabetic range.  [place] Spotted Fever titers  and Lyme titers were also normal.  Uric acid was low at 4.1 and  rheumatoid arthritis factor was negative.  Sed rate was elevated at 47.  C-reactive protein was also elevated at 8.   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  Films of  the shoulder were negative as was the chest x-ray.  MRI of the shoulder  on June 23 revealed a large effusion with inflammatory changes around  the shoulder on the left.  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  There was diffuse edema  radiating around the shoulder joint.  Fluoroscopic guidance was employed  for a tap of the joint on June 24 but only a scant amount of material  was obtained  The wound cultures and anaerobic cultures are pending but  Gram stain revealed no white cells and no organisms.   She continued to have low-grade fevers with a max of 100.8.  The morning  of discharge she was afebrile.  She had no lymphadenopathy about the  head, neck or axilla.  There was no evidence of cellulitis.  Chest was  clear.  She had an S4 with no significant murmurs.  She did have some  improvement in range of motion of the left shoulder.  She stated  that  the pain had moved from the left shoulder to the left scapular area.  A  sling was being employed when the patient was up and mobile.  On the day  of discharge her pulse was 66, respiratory rate 18, blood pressure  111/65.  O2 sats were 97%.   Arrangements were made for followup with Dr. Norlene Campbell in 7-10  days or sooner if the symptoms persist.  She wishes to return to work  June 28.  If she is unable to do so then I have recommended she see Dr.  Cleophas Dunker on Monday.   DISCHARGE MEDICATIONS:  1. She was discharged on her maintenance medicines of Budeprion SR 150      mg twice a day.  2. Effexor XR 37.5 mg each morning.  3. HCTZ 12.5 mg daily one daily as needed for swelling.  4. Singulair 10 mg daily.  5. New medications included Aleve 1-2 every 8-12 hours as needed for      pain, taken with food.  6. She was also given a prescription for tramadol 50 mg one to two      every 4-6 hours as needed.  7. She was given generic Flexeril 5 mg twice a day and two  at bedtime      as needed as a muscle relaxant.  8. She was instructed to take hydrochlorothiazide with orange juice or      a banana because of the mild hypokalemia.  9. NoSalt was to be used as a seasoning at the table as this is a      potassium supplement.   DISCHARGE STATUS:  Is improved.   PROGNOSIS:  Depends on response to orthopedic intervention.   The etiology of the shoulder syndrome was felt to be an overuse  phenomenon with possible soft tissue damage with subsequent  inflammation, fever and effusion.      Titus Dubin. Alwyn Ren, MD,FACP,FCCP  Electronically Signed     [facility]  D:  03/05/2009  T:  03/05/2009  Job:  [id number]   cc:   Claude Manges. Cleophas Dunker, M.D.  Fax: [phone]

## 2015-02-22 ENCOUNTER — Telehealth: Payer: Self-pay | Admitting: Internal Medicine

## 2015-02-22 NOTE — Telephone Encounter (Signed)
Patient is seeing a Librarian, academic in [place] that is leaving the practice, and would like to know what prescription he use to give her for her breathing machine, please advise

## 2015-05-12 ENCOUNTER — Encounter: Payer: Self-pay | Admitting: Internal Medicine

## 2015-05-14 ENCOUNTER — Encounter: Payer: Self-pay | Admitting: Internal Medicine
